# Patient Record
Sex: Male | Born: 1995 | Race: Black or African American | Hispanic: No | Marital: Single | Smoking: Current every day smoker
Health system: Southern US, Community
[De-identification: ages and names within clinical notes are randomized; demographics above are authoritative.]

---
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
## 2014-01-21 ENCOUNTER — Encounter (HOSPITAL_COMMUNITY): Payer: Self-pay | Admitting: Emergency Medicine

## 2014-01-21 ENCOUNTER — Emergency Department (HOSPITAL_COMMUNITY)
Admission: EM | Admit: 2014-01-21 | Discharge: 2014-01-21 | Disposition: A | Discharge status: Home / Self Care | Payer: Self-pay | Attending: Emergency Medicine | Admitting: Emergency Medicine

## 2014-01-21 DIAGNOSIS — S0003XA Contusion of scalp, initial encounter: Secondary | ICD-10-CM | POA: Insufficient documentation

## 2014-01-21 DIAGNOSIS — S0083XA Contusion of other part of head, initial encounter: Secondary | ICD-10-CM

## 2014-01-21 DIAGNOSIS — S1093XA Contusion of unspecified part of neck, initial encounter: Secondary | ICD-10-CM

## 2014-01-21 DIAGNOSIS — S0093XA Contusion of unspecified part of head, initial encounter: Secondary | ICD-10-CM

## 2014-01-21 DIAGNOSIS — F17200 Nicotine dependence, unspecified, uncomplicated: Secondary | ICD-10-CM | POA: Insufficient documentation

## 2014-01-21 DIAGNOSIS — IMO0002 Reserved for concepts with insufficient information to code with codable children: Secondary | ICD-10-CM | POA: Insufficient documentation

## 2014-01-21 DIAGNOSIS — S01311A Laceration without foreign body of right ear, initial encounter: Secondary | ICD-10-CM

## 2014-01-21 DIAGNOSIS — S01309A Unspecified open wound of unspecified ear, initial encounter: Secondary | ICD-10-CM | POA: Insufficient documentation

## 2014-01-21 MED ORDER — NAPROXEN 500 MG PO TABS: 500.0000 mg | ORAL_TABLET | Freq: Two times a day (BID) | ORAL | Status: AC

## 2014-01-21 MED ORDER — NAPROXEN 250 MG PO TABS
500.0000 mg | ORAL_TABLET | Freq: Once | ORAL | Status: AC
  Administered 2014-01-21: 500 mg via ORAL
  Filled 2014-01-21: qty 2

## 2014-01-21 NOTE — Discharge Instructions (Signed)
Emergency Department Resource Guide °1) Find a Doctor and Pay Out of Pocket °Although you won't have to find out who is covered by your insurance plan, it is a good idea to ask around and get recommendations. You will then need to call the office and see if the doctor you have chosen will accept you as a new patient and what types of options they offer for patients who are self-pay. Some doctors offer discounts or will set up payment plans for their patients who do not have insurance, but you will need to ask so you aren't surprised when you get to your appointment. ° °2) Contact Your Local Health Department °Not all health departments have doctors that can see patients for sick visits, but many do, so it is worth a call to see if yours does. If you don't know where your local health department is, you can check in your phone book. The CDC also has a tool to help you locate your state's health department, and many state websites also have listings of all of their local health departments. ° °3) Find a Walk-in Clinic °If your illness is not likely to be very severe or complicated, you may want to try a walk in clinic. These are popping up all over the country in pharmacies, drugstores, and shopping centers. They're usually staffed by nurse practitioners or physician assistants that have been trained to treat common illnesses and complaints. They're usually fairly quick and inexpensive. However, if you have serious medical issues or chronic medical problems, these are probably not your best option. ° °No Primary Care Doctor: °- Call [facility] at  832-8000 - they can help you locate a primary care doctor that  accepts your insurance, provides certain services, etc. °- Physician Referral Service- 1-800-533-3463 ° °Chronic Pain Problems: °Organization         Address  Phone   Notes  °[facility]  ([phone]) [phone] Patients need to be referred by their primary care doctor.  ° °Medication  Assistance: °Organization         Address  Phone   Notes  °[place] [facility] [address] °[place], [place] [address] ([phone]) 641-8030 --Must be a resident of [place] °-- Must have NO insurance coverage whatsoever (no Medicaid/ Medicare, etc.) °-- The pt. MUST have a primary care doctor that directs their care regularly and follows them in the community °  °[facility]  (866) 331-1348   °[employer]  (888) 892-1162   ° °Agencies that provide inexpensive medical care: °Organization         Address                                                       Phone                                                                            Notes  °[facility]  ([phone]) [phone]   °[facility]    (336)   832-7272   °[facility] [address] °[place], [place] [address] (336) 832-4777   °[facility] [address], °[place] (336) 271-4999   °[facility]    (336) 373-0678   °[facility]    (336) 272-1050   °[facility] ° [address], [place] Phone:  (336) 832-4444, Fax:  (336) [phone] Hours of Operation:  9 am - 6 pm, M-F.  Also accepts Medicaid/Medicare and self-pay.  °[facility] ° [address], [place] Phone: (336) [phone], Fax: (336) [phone]. Hours of Operation:  8:30 am - 5:30 pm, M-F.  Also accepts Medicaid and self-pay.  °[facility] [place] [address], [place] Phone: (336) [phone]   °[facility] [address], [place], [place] (336)723-1848, Ext. 123 Mondays & Thursdays: 7-9 AM.  First 15 patients are seen on a first come, first serve basis. °  ° °Medicaid-accepting [place] Providers: ° °Organization         Address                                                                       Phone                               Notes  °[facility] [address], [place] (336) [phone] Also accepts self-pay patients.  °[facility] [address], [place] ° (336) [phone]   °[facility] [address], [place] (336) [phone]   °[facility] [address] [place] [address], [place] (336) 299-7000   °[facility] [address], [place]  ° (336) [phone] Only accepts [place] Access Medicaid patients after they have their name applied to their card.  ° °Self-Pay (no insurance) in [place]: °  °Organization         Address                                                     Phone               Notes  °Sickle Cell Patients, [facility] [address], [place] (336) [phone]   °[facility] [address], [place] (336) [phone]   °[facility] ° [address], [place] (336) [phone]   °[facility]/Dr. Osei-Bonsu ° [address] [place] [address], [place] or [address], [place] ([phone]) [phone] Phone number for both [place] and [place] locations is the same.  °[facility] [address], [place] (336) [phone]   °[facility] [address] [place] [address], [place] or [address] (336) 852-7530 °(336) 878-2260   °[facility] [address],  (336) [phone], phone; (336) [phone], fax Sees patients 1st and 3rd Saturday of   every month.  Must not qualify for public or private insurance (i.e. Medicaid, Medicare, [place] Health Choice, Veterans' Benefits)  Household income should be no more than 200% of the poverty level The clinic cannot treat you if you are pregnant or think you are pregnant  Sexually transmitted diseases are not treated at the clinic.    Blunt Trauma You have been evaluated for injuries. You have been examined and your caregiver has not found injuries serious enough to require  hospitalization. It is common to have multiple bruises and sore muscles following an accident. These tend to feel worse for the first 24 hours. You will feel more stiffness and soreness over the next several hours and worse when you wake up the first morning after your accident. After this point, you should begin to improve with each passing day. The amount of improvement depends on the amount of damage done in the accident. Following your accident, if some part of your body does not work as it should, or if the pain in any area continues to increase, you should return to the Emergency Department for re-evaluation.  HOME CARE INSTRUCTIONS  Routine care for sore areas should include:  Ice to sore areas every 2 hours for 20 minutes while awake for the next 2 days.  Drink extra fluids (not alcohol).  Take a hot or warm shower or bath once or twice a day to increase blood flow to sore muscles. This will help you "limber up".  Activity as tolerated. Lifting may aggravate neck or back pain.  Only take over-the-counter or prescription medicines for pain, discomfort, or fever as directed by your caregiver. Do not use aspirin. This may increase bruising or increase bleeding if there are small areas where this is happening. SEEK IMMEDIATE MEDICAL CARE IF:  Numbness, tingling, weakness, or problem with the use of your arms or legs.  A severe headache is not relieved with medications.  There is a change in bowel or bladder control.  Increasing pain in any areas of the body.  Short of breath or dizzy.  Nauseated, vomiting, or sweating.  Increasing belly (abdominal) discomfort.  Blood in urine, stool, or vomiting blood.  Pain in either shoulder in an area where a shoulder strap would be.  Feelings of lightheadedness or if you have a fainting episode. Sometimes it is not possible to identify all injuries immediately after the trauma. It is important that you continue to monitor your condition  after the emergency department visit. If you feel you are not improving, or improving more slowly than should be expected, call your physician. If you feel your symptoms (problems) are worsening, return to the Emergency Department immediately. Document Released: 07/31/2001 Document Revised: 01/27/2012 Document Reviewed: 06/22/2008 [facility] Patient Information 2014 [place], [place].

## 2014-01-21 NOTE — ED Notes (Signed)
E-signature pad in room or at nurses station not working
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
## 2014-01-21 NOTE — ED Notes (Signed)
The pt is also c/o a headache 
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
## 2014-01-22 NOTE — ED Provider Notes (Signed)
Medical screening examination/treatment/procedure(s) were performed by non-physician practitioner and as supervising physician I was immediately available for consultation/collaboration.   EKG Interpretation None        Myrna Vonseggern W. Aryn Kops, MD 01/22/14 1936 

## 2014-03-18 ENCOUNTER — Emergency Department (HOSPITAL_COMMUNITY)
Admission: EM | Admit: 2014-03-18 | Discharge: 2014-03-18 | Disposition: A | Discharge status: Home / Self Care | Payer: Self-pay | Attending: Emergency Medicine | Admitting: Emergency Medicine

## 2014-03-18 ENCOUNTER — Encounter (HOSPITAL_COMMUNITY): Payer: Self-pay | Admitting: Emergency Medicine

## 2014-03-18 DIAGNOSIS — F17200 Nicotine dependence, unspecified, uncomplicated: Secondary | ICD-10-CM | POA: Insufficient documentation

## 2014-03-18 DIAGNOSIS — H6690 Otitis media, unspecified, unspecified ear: Secondary | ICD-10-CM | POA: Insufficient documentation

## 2014-03-18 DIAGNOSIS — H6691 Otitis media, unspecified, right ear: Secondary | ICD-10-CM

## 2014-03-18 DIAGNOSIS — Z791 Long term (current) use of non-steroidal anti-inflammatories (NSAID): Secondary | ICD-10-CM | POA: Insufficient documentation

## 2014-03-18 DIAGNOSIS — R51 Headache: Secondary | ICD-10-CM | POA: Insufficient documentation

## 2014-03-18 MED ORDER — AMOXICILLIN 500 MG PO CAPS: 500.0000 mg | ORAL_CAPSULE | Freq: Three times a day (TID) | ORAL | Status: DC

## 2014-03-18 NOTE — ED Provider Notes (Signed)
Medical screening examination/treatment/procedure(s) were performed by non-physician practitioner and as supervising physician I was immediately available for consultation/collaboration.  Tore Carreker T Dior Stepter, MD 03/18/14 2315 

## 2014-03-18 NOTE — Discharge Instructions (Signed)
Take antibiotic to completion for the next 7 days. Take ibuprofen or tylenol every 6 hours as needed for pain.  Otitis Media, Adult Otitis media is redness, soreness, and swelling (inflammation) of the middle ear. Otitis media may be caused by allergies or, most commonly, by infection. Often it occurs as a complication of the common cold. SIGNS AND SYMPTOMS Symptoms of otitis media may include:  Earache.  Fever.  Ringing in your ear.  Headache.  Leakage of fluid from the ear. DIAGNOSIS To diagnose otitis media, your health care provider will examine your ear with an otoscope. This is an instrument that allows your health care provider to see into your ear in order to examine your eardrum. Your health care provider also will ask you questions about your symptoms. TREATMENT  Typically, otitis media resolves on its own within 3 5 days. Your health care provider may prescribe medicine to ease your symptoms of pain. If otitis media does not resolve within 5 days or is recurrent, your health care provider may prescribe antibiotic medicines if he or she suspects that a bacterial infection is the cause. HOME CARE INSTRUCTIONS   Take your medicine as directed until it is gone, even if you feel better after the first few days.  Only take over-the-counter or prescription medicines for pain, discomfort, or fever as directed by your health care provider.  Follow up with your health care provider as directed. SEEK MEDICAL CARE IF:  You have otitis media only in one ear or bleeding from your nose or both.  You notice a lump on your neck.  You are not getting better in 3 5 days.  You feel worse instead of better. SEEK IMMEDIATE MEDICAL CARE IF:   You have pain that is not controlled with medicine.  You have swelling, redness, or pain around your ear or stiffness in your neck.  You notice that part of your face is paralyzed.  You notice that the bone behind your ear (mastoid) is tender  when you touch it. MAKE SURE YOU:   Understand these instructions.  Will watch your condition.  Will get help right away if you are not doing well or get worse. Document Released: 08/09/2004 Document Revised: 08/25/2013 Document Reviewed: 06/01/2013 [facility] Patient Information 2014 [place] Freedom[place]Care, [place]LLC.

## 2014-03-18 NOTE — ED Provider Notes (Signed)
CSN: [id number]     Arrival date & time 03/18/14  1450 History  This chart was scribed for non-physician practitioner Johnnette Gourdobyn Albert, PA-C working with Toy BakerAnthony T Allen, MD by Danella Maiersaroline Early, ED Scribe. This patient was seen in room WTR9/WTR9 and the patient's care was started at 3:30 PM.    Chief Complaint  Patient presents with  . Otalgia   The history is provided by the patient. No language interpreter was used.   HPI Comments: Manuel Blair is a 18 y.o. male who presents to the Emergency Department complaining of right otalgia onset this morning with associated headaches. He states the ear feels like it is popping. He rates the severity of the pain as an 8/10 currently. He took Benadryl with no relief. He denies ear discharge, nasal congestion, fevers, or tooth pain. He denies any problems with the left ear. He states his one-year-old sister is sick but if not sure what she has, she does not go to daycare.   History reviewed. No pertinent past medical history. History reviewed. No pertinent past surgical history. History reviewed. No pertinent family history. History  Substance Use Topics  . Smoking status: Current Every Day Smoker  . Smokeless tobacco: Not on file  . Alcohol Use: Yes    Review of Systems  Constitutional: Negative for fever.  HENT: Positive for ear pain. Negative for congestion, dental problem and ear discharge.    A complete 10 system review of systems was obtained and all systems are negative except as noted in the HPI and PMH.     Allergies  Review of patient's allergies indicates no known allergies.  Home Medications   Prior to Admission medications   Medication Sig Start Date End Date Taking? Authorizing Provider  naproxen (NAPROSYN) 500 MG tablet Take 1 tablet (500 mg total) by mouth 2 (two) times daily with a meal. 01/21/14   Junius FinnerErin O'Malley, PA-C   BP 144/91  Pulse 55  Temp(Src) 98.2 F (36.8 C) (Oral)  Resp 16  SpO2 100% Physical Exam  Nursing note  and vitals reviewed. Constitutional: He is oriented to person, place, and time. He appears well-developed and well-nourished. No distress.  HENT:  Head: Normocephalic and atraumatic.  Right Ear: No drainage. Tympanic membrane is injected, erythematous and bulging. No middle ear effusion.  Left Ear: Tympanic membrane normal.  Eyes: Conjunctivae and EOM are normal.  Neck: Normal range of motion. Neck supple.  Cardiovascular: Normal rate, regular rhythm and normal heart sounds.   Pulmonary/Chest: Effort normal and breath sounds normal.  Musculoskeletal: Normal range of motion. He exhibits no edema.  Neurological: He is alert and oriented to person, place, and time.  Skin: Skin is warm and dry.  Psychiatric: He has a normal mood and affect. His behavior is normal.    ED Course  Procedures (including critical care time) Medications - No data to display  DIAGNOSTIC STUDIES: Oxygen Saturation is 100% on RA, normal by my interpretation.    COORDINATION OF CARE: 3:41 PM- Discussed treatment plan with pt which includes discharge home with antibiotics. Pt agrees to plan.    Labs Review Labs Reviewed - No data to display  Imaging Review No results found.   EKG Interpretation None      MDM   Final diagnoses:  Otitis media, right    Tx with amoxil. Advised NSAIDs for pain. Stable for d/c. Return precautions given. Patient states understanding of treatment care plan and is agreeable.   I personally performed the services  described in this documentation, which was scribed in my presence. The recorded information has been reviewed and is accurate.   Trevor MaceRobyn M Albert, PA-C 03/18/14 [phone]

## 2014-03-18 NOTE — ED Notes (Signed)
Pt reports right ear pain starting this morning. Denies any discharge, sts pain is 8/10.

## 2014-03-18 NOTE — Progress Notes (Signed)
[facility] provided pt with a list of primary care resources to help patient establish primary care.  °

## 2019-11-24 ENCOUNTER — Emergency Department (HOSPITAL_COMMUNITY): Payer: No Typology Code available for payment source

## 2019-11-24 ENCOUNTER — Inpatient Hospital Stay (HOSPITAL_COMMUNITY)
Admission: EM | Admit: 2019-11-24 | Discharge: 2019-12-02 | DRG: 471 | Disposition: A | Discharge status: Home Health | Payer: No Typology Code available for payment source | Attending: General Surgery | Admitting: General Surgery

## 2019-11-24 ENCOUNTER — Encounter (HOSPITAL_COMMUNITY): Admission: EM | Disposition: A | Discharge status: Home Health | Payer: Self-pay | Source: Home / Self Care

## 2019-11-24 ENCOUNTER — Inpatient Hospital Stay (HOSPITAL_COMMUNITY): Payer: No Typology Code available for payment source

## 2019-11-24 ENCOUNTER — Inpatient Hospital Stay (HOSPITAL_COMMUNITY): Payer: No Typology Code available for payment source | Admitting: Certified Registered Nurse Anesthetist

## 2019-11-24 ENCOUNTER — Encounter (HOSPITAL_COMMUNITY): Payer: Self-pay | Admitting: General Surgery

## 2019-11-24 DIAGNOSIS — S15109A Unspecified injury of unspecified vertebral artery, initial encounter: Secondary | ICD-10-CM | POA: Diagnosis present

## 2019-11-24 DIAGNOSIS — R0902 Hypoxemia: Secondary | ICD-10-CM

## 2019-11-24 DIAGNOSIS — S22039A Unspecified fracture of third thoracic vertebra, initial encounter for closed fracture: Secondary | ICD-10-CM | POA: Diagnosis present

## 2019-11-24 DIAGNOSIS — J939 Pneumothorax, unspecified: Secondary | ICD-10-CM

## 2019-11-24 DIAGNOSIS — S42101A Fracture of unspecified part of scapula, right shoulder, initial encounter for closed fracture: Secondary | ICD-10-CM | POA: Diagnosis present

## 2019-11-24 DIAGNOSIS — S270XXA Traumatic pneumothorax, initial encounter: Secondary | ICD-10-CM

## 2019-11-24 DIAGNOSIS — S12600A Unspecified displaced fracture of seventh cervical vertebra, initial encounter for closed fracture: Secondary | ICD-10-CM | POA: Diagnosis present

## 2019-11-24 DIAGNOSIS — S12601A Unspecified nondisplaced fracture of seventh cervical vertebra, initial encounter for closed fracture: Secondary | ICD-10-CM

## 2019-11-24 DIAGNOSIS — D62 Acute posthemorrhagic anemia: Secondary | ICD-10-CM | POA: Diagnosis not present

## 2019-11-24 DIAGNOSIS — T1490XA Injury, unspecified, initial encounter: Secondary | ICD-10-CM

## 2019-11-24 DIAGNOSIS — S2243XA Multiple fractures of ribs, bilateral, initial encounter for closed fracture: Secondary | ICD-10-CM | POA: Diagnosis present

## 2019-11-24 DIAGNOSIS — Z23 Encounter for immunization: Secondary | ICD-10-CM

## 2019-11-24 DIAGNOSIS — S22049A Unspecified fracture of fourth thoracic vertebra, initial encounter for closed fracture: Secondary | ICD-10-CM | POA: Diagnosis present

## 2019-11-24 DIAGNOSIS — S42102A Fracture of unspecified part of scapula, left shoulder, initial encounter for closed fracture: Secondary | ICD-10-CM | POA: Diagnosis present

## 2019-11-24 DIAGNOSIS — M4802 Spinal stenosis, cervical region: Secondary | ICD-10-CM | POA: Diagnosis present

## 2019-11-24 DIAGNOSIS — S2231XA Fracture of one rib, right side, initial encounter for closed fracture: Secondary | ICD-10-CM

## 2019-11-24 DIAGNOSIS — S22019A Unspecified fracture of first thoracic vertebra, initial encounter for closed fracture: Principal | ICD-10-CM | POA: Diagnosis present

## 2019-11-24 DIAGNOSIS — S22059A Unspecified fracture of T5-T6 vertebra, initial encounter for closed fracture: Secondary | ICD-10-CM | POA: Diagnosis present

## 2019-11-24 DIAGNOSIS — S272XXA Traumatic hemopneumothorax, initial encounter: Secondary | ICD-10-CM | POA: Diagnosis present

## 2019-11-24 DIAGNOSIS — J942 Hemothorax: Secondary | ICD-10-CM

## 2019-11-24 DIAGNOSIS — R1310 Dysphagia, unspecified: Secondary | ICD-10-CM | POA: Diagnosis present

## 2019-11-24 DIAGNOSIS — S42202A Unspecified fracture of upper end of left humerus, initial encounter for closed fracture: Secondary | ICD-10-CM

## 2019-11-24 DIAGNOSIS — S42252A Displaced fracture of greater tuberosity of left humerus, initial encounter for closed fracture: Secondary | ICD-10-CM | POA: Diagnosis present

## 2019-11-24 DIAGNOSIS — S27322A Contusion of lung, bilateral, initial encounter: Secondary | ICD-10-CM

## 2019-11-24 DIAGNOSIS — J90 Pleural effusion, not elsewhere classified: Secondary | ICD-10-CM

## 2019-11-24 DIAGNOSIS — Y92410 Unspecified street and highway as the place of occurrence of the external cause: Secondary | ICD-10-CM

## 2019-11-24 DIAGNOSIS — Z419 Encounter for procedure for purposes other than remedying health state, unspecified: Secondary | ICD-10-CM

## 2019-11-24 DIAGNOSIS — Z20822 Contact with and (suspected) exposure to covid-19: Secondary | ICD-10-CM | POA: Diagnosis present

## 2019-11-24 HISTORY — PX: ANTERIOR CERVICAL CORPECTOMY: SHX1159

## 2019-11-24 LAB — CBC
HCT: 45.6 % (ref 39.0–52.0)
HCT: 41.6 % (ref 39.0–52.0)
Hemoglobin: 14.3 g/dL (ref 13.0–17.0)
Hemoglobin: 13.3 g/dL (ref 13.0–17.0)
MCH: 28.5 pg (ref 26.0–34.0)
MCH: 28.5 pg (ref 26.0–34.0)
MCHC: 31.4 g/dL (ref 30.0–36.0)
MCHC: 32 g/dL (ref 30.0–36.0)
MCV: 90.8 fL (ref 80.0–100.0)
MCV: 89.3 fL (ref 80.0–100.0)
Platelets: 252 10*3/uL (ref 150–400)
Platelets: 215 10*3/uL (ref 150–400)
RBC: 5.02 MIL/uL (ref 4.22–5.81)
RBC: 4.66 MIL/uL (ref 4.22–5.81)
RDW: 12.4 % (ref 11.5–15.5)
RDW: 12.5 % (ref 11.5–15.5)
WBC: 20.2 10*3/uL — ABNORMAL HIGH (ref 4.0–10.5)
WBC: 22.3 10*3/uL — ABNORMAL HIGH (ref 4.0–10.5)
nRBC: 0.1 % (ref 0.0–0.2)
nRBC: 0 % (ref 0.0–0.2)

## 2019-11-24 LAB — BASIC METABOLIC PANEL
Anion gap: 9 (ref 5–15)
BUN: 11 mg/dL (ref 6–20)
CO2: 25 mmol/L (ref 22–32)
Calcium: 8.1 mg/dL — ABNORMAL LOW (ref 8.9–10.3)
Chloride: 102 mmol/L (ref 98–111)
Creatinine, Ser: 1.04 mg/dL (ref 0.61–1.24)
GFR calc Af Amer: >60 mL/min (ref >60)
GFR calc non Af Amer: >60 mL/min (ref >60)
Glucose, Bld: 144 mg/dL — ABNORMAL HIGH (ref 70–99)
Potassium: 3.7 mmol/L (ref 3.5–5.1)
Sodium: 136 mmol/L (ref 135–145)

## 2019-11-24 LAB — COMPREHENSIVE METABOLIC PANEL
ALT: 72 U/L — ABNORMAL HIGH (ref 0–44)
AST: 138 U/L — ABNORMAL HIGH (ref 15–41)
Albumin: 3.8 g/dL (ref 3.5–5.0)
Alkaline Phosphatase: 64 U/L (ref 38–126)
Anion gap: 11 (ref 5–15)
BUN: 14 mg/dL (ref 6–20)
CO2: 23 mmol/L (ref 22–32)
Calcium: 8.5 mg/dL — ABNORMAL LOW (ref 8.9–10.3)
Chloride: 103 mmol/L (ref 98–111)
Creatinine, Ser: 1.24 mg/dL (ref 0.61–1.24)
GFR calc Af Amer: >60 mL/min (ref >60)
GFR calc non Af Amer: >60 mL/min (ref >60)
Glucose, Bld: 201 mg/dL — ABNORMAL HIGH (ref 70–99)
Potassium: 3.3 mmol/L — ABNORMAL LOW (ref 3.5–5.1)
Sodium: 137 mmol/L (ref 135–145)
Total Bilirubin: 0.5 mg/dL (ref 0.3–1.2)
Total Protein: 6.6 g/dL (ref 6.5–8.1)

## 2019-11-24 LAB — URINALYSIS, ROUTINE W REFLEX MICROSCOPIC
Bilirubin Urine: NEGATIVE
Glucose, UA: 50 mg/dL — AB
Hgb urine dipstick: NEGATIVE
Ketones, ur: 5 mg/dL — AB
Leukocytes,Ua: NEGATIVE
Nitrite: NEGATIVE
Protein, ur: NEGATIVE mg/dL
Specific Gravity, Urine: 1.043 — ABNORMAL HIGH (ref 1.005–1.030)
pH: 5 (ref 5.0–8.0)

## 2019-11-24 LAB — PROTIME-INR
INR: 1.1 (ref 0.8–1.2)
Prothrombin Time: 14.1 seconds (ref 11.4–15.2)

## 2019-11-24 LAB — RESPIRATORY PANEL BY RT PCR (FLU A&B, COVID)
Influenza A by PCR: NEGATIVE
Influenza B by PCR: NEGATIVE
SARS Coronavirus 2 by RT PCR: NEGATIVE

## 2019-11-24 LAB — I-STAT CHEM 8, ED
BUN: 14 mg/dL (ref 6–20)
Calcium, Ion: 1.06 mmol/L — ABNORMAL LOW (ref 1.15–1.40)
Chloride: 103 mmol/L (ref 98–111)
Creatinine, Ser: 1.1 mg/dL (ref 0.61–1.24)
Glucose, Bld: 196 mg/dL — ABNORMAL HIGH (ref 70–99)
HCT: 47 % (ref 39.0–52.0)
Hemoglobin: 16 g/dL (ref 13.0–17.0)
Potassium: 3.1 mmol/L — ABNORMAL LOW (ref 3.5–5.1)
Sodium: 139 mmol/L (ref 135–145)
TCO2: 25 mmol/L (ref 22–32)

## 2019-11-24 LAB — ETHANOL: Alcohol, Ethyl (B): <10 mg/dL (ref <10)

## 2019-11-24 LAB — ABO/RH: ABO/RH(D): O POS

## 2019-11-24 LAB — LACTIC ACID, PLASMA: Lactic Acid, Venous: 2.4 mmol/L (ref 0.5–1.9)

## 2019-11-24 LAB — MRSA PCR SCREENING: MRSA by PCR: NEGATIVE

## 2019-11-24 LAB — RAPID URINE DRUG SCREEN, HOSP PERFORMED
Amphetamines: NOT DETECTED
Barbiturates: NOT DETECTED
Benzodiazepines: NOT DETECTED
Cocaine: NOT DETECTED
Opiates: NOT DETECTED
Tetrahydrocannabinol: POSITIVE — AB

## 2019-11-24 LAB — CDS SEROLOGY

## 2019-11-24 LAB — SAMPLE TO BLOOD BANK

## 2019-11-24 LAB — HIV ANTIBODY (ROUTINE TESTING W REFLEX): HIV Screen 4th Generation wRfx: NONREACTIVE

## 2019-11-24 LAB — TYPE AND SCREEN
ABO/RH(D): O POS
Antibody Screen: NEGATIVE

## 2019-11-24 SURGERY — ANTERIOR CERVICAL CORPECTOMY
Anesthesia: General | Site: Spine Cervical

## 2019-11-24 MED ORDER — SODIUM CHLORIDE 0.9 % IV SOLN: INTRAVENOUS | Status: DC

## 2019-11-24 MED ORDER — TETANUS-DIPHTH-ACELL PERTUSSIS 5-2.5-18.5 LF-MCG/0.5 IM SUSP
0.5000 mL | Freq: Once | INTRAMUSCULAR | Status: AC
  Administered 2019-11-24: 01:00:00 0.5 mL via INTRAMUSCULAR
  Filled 2019-11-24: qty 0.5

## 2019-11-24 MED ORDER — SODIUM CHLORIDE 0.9 % IV SOLN: INTRAVENOUS | Status: DC | PRN

## 2019-11-24 MED ORDER — SODIUM CHLORIDE 0.9% FLUSH
3.0000 mL | INTRAVENOUS | Status: DC | PRN
  Administered 2019-12-01 – 2019-12-02 (×2): 3 mL via INTRAVENOUS

## 2019-11-24 MED ORDER — CEFAZOLIN SODIUM-DEXTROSE 1-4 GM/50ML-% IV SOLN
1.0000 g | Freq: Three times a day (TID) | INTRAVENOUS | Status: AC
  Administered 2019-11-24 – 2019-11-25 (×2): 1 g via INTRAVENOUS
  Filled 2019-11-24 (×2): qty 50

## 2019-11-24 MED ORDER — PROPOFOL 10 MG/ML IV BOLUS
INTRAVENOUS | Status: DC | PRN
  Administered 2019-11-24: 200 mg via INTRAVENOUS

## 2019-11-24 MED ORDER — ACETAMINOPHEN 500 MG PO TABS
1000.0000 mg | ORAL_TABLET | Freq: Four times a day (QID) | ORAL | Status: DC
  Administered 2019-11-24 – 2019-12-01 (×21): 1000 mg via ORAL
  Filled 2019-11-24 (×26): qty 2

## 2019-11-24 MED ORDER — 0.9 % SODIUM CHLORIDE (POUR BTL) OPTIME
TOPICAL | Status: DC | PRN
  Administered 2019-11-24: 1000 mL

## 2019-11-24 MED ORDER — CHLORHEXIDINE GLUCONATE CLOTH 2 % EX PADS
6.0000 | MEDICATED_PAD | Freq: Every day | CUTANEOUS | Status: DC
  Administered 2019-11-24: 20:00:00 6 via TOPICAL

## 2019-11-24 MED ORDER — FENTANYL CITRATE (PF) 250 MCG/5ML IJ SOLN
INTRAMUSCULAR | Status: AC
  Filled 2019-11-24: qty 5

## 2019-11-24 MED ORDER — PANTOPRAZOLE SODIUM 40 MG IV SOLR: 40.0000 mg | Freq: Every day | INTRAVENOUS | Status: DC

## 2019-11-24 MED ORDER — LACTATED RINGERS IV SOLN: INTRAVENOUS | Status: DC | PRN

## 2019-11-24 MED ORDER — ONDANSETRON HCL 4 MG/2ML IJ SOLN: 4.0000 mg | Freq: Four times a day (QID) | INTRAMUSCULAR | Status: DC | PRN

## 2019-11-24 MED ORDER — ROCURONIUM BROMIDE 10 MG/ML (PF) SYRINGE
PREFILLED_SYRINGE | INTRAVENOUS | Status: DC | PRN
  Administered 2019-11-24: 70 mg via INTRAVENOUS
  Administered 2019-11-24: 20 mg via INTRAVENOUS
  Administered 2019-11-24: 30 mg via INTRAVENOUS

## 2019-11-24 MED ORDER — ROCURONIUM BROMIDE 10 MG/ML (PF) SYRINGE
PREFILLED_SYRINGE | INTRAVENOUS | Status: AC
  Filled 2019-11-24: qty 10

## 2019-11-24 MED ORDER — HYDROMORPHONE HCL 1 MG/ML IJ SOLN
1.0000 mg | Freq: Once | INTRAMUSCULAR | Status: AC
  Administered 2019-11-24: 02:00:00 1 mg via INTRAVENOUS

## 2019-11-24 MED ORDER — DEXAMETHASONE SODIUM PHOSPHATE 10 MG/ML IJ SOLN
INTRAMUSCULAR | Status: DC | PRN
  Administered 2019-11-24: 10 mg via INTRAVENOUS

## 2019-11-24 MED ORDER — SODIUM CHLORIDE 0.9% FLUSH
3.0000 mL | Freq: Two times a day (BID) | INTRAVENOUS | Status: DC
  Administered 2019-11-25 – 2019-12-02 (×14): 3 mL via INTRAVENOUS

## 2019-11-24 MED ORDER — IOHEXOL 300 MG/ML  SOLN
100.0000 mL | Freq: Once | INTRAMUSCULAR | Status: AC | PRN
  Administered 2019-11-24: 02:00:00 100 mL via INTRAVENOUS

## 2019-11-24 MED ORDER — ACETAMINOPHEN 325 MG PO TABS: 650.0000 mg | ORAL_TABLET | ORAL | Status: DC | PRN

## 2019-11-24 MED ORDER — IOHEXOL 300 MG/ML  SOLN
75.0000 mL | Freq: Once | INTRAMUSCULAR | Status: AC | PRN
  Administered 2019-11-24: 05:00:00 75 mL via INTRAVENOUS

## 2019-11-24 MED ORDER — THROMBIN 5000 UNITS EX SOLR
CUTANEOUS | Status: AC
  Filled 2019-11-24: qty 10000

## 2019-11-24 MED ORDER — HYDROMORPHONE HCL 1 MG/ML IJ SOLN
1.0000 mg | INTRAMUSCULAR | Status: DC | PRN
  Administered 2019-11-25 – 2019-11-27 (×6): 1 mg via INTRAVENOUS
  Filled 2019-11-24 (×5): qty 1

## 2019-11-24 MED ORDER — PHENOL 1.4 % MT LIQD: 1.0000 | OROMUCOSAL | Status: DC | PRN

## 2019-11-24 MED ORDER — PANTOPRAZOLE SODIUM 40 MG PO TBEC
40.0000 mg | DELAYED_RELEASE_TABLET | Freq: Every day | ORAL | Status: DC
  Administered 2019-11-24 – 2019-11-25 (×2): 40 mg via ORAL
  Filled 2019-11-24 (×2): qty 1

## 2019-11-24 MED ORDER — SODIUM CHLORIDE 0.9 % IV BOLUS
1000.0000 mL | Freq: Once | INTRAVENOUS | Status: AC
  Administered 2019-11-24: 05:00:00 1000 mL via INTRAVENOUS

## 2019-11-24 MED ORDER — THROMBIN 20000 UNITS EX SOLR: CUTANEOUS | Status: DC | PRN

## 2019-11-24 MED ORDER — HYDROMORPHONE HCL 1 MG/ML IJ SOLN
INTRAMUSCULAR | Status: AC
  Filled 2019-11-24: qty 1

## 2019-11-24 MED ORDER — ONDANSETRON HCL 4 MG/2ML IJ SOLN
4.0000 mg | Freq: Once | INTRAMUSCULAR | Status: AC
  Administered 2019-11-24: 01:00:00 4 mg via INTRAVENOUS
  Filled 2019-11-24: qty 2

## 2019-11-24 MED ORDER — DEXAMETHASONE SODIUM PHOSPHATE 10 MG/ML IJ SOLN
INTRAMUSCULAR | Status: AC
  Filled 2019-11-24: qty 1

## 2019-11-24 MED ORDER — HYDROCODONE-ACETAMINOPHEN 10-325 MG PO TABS
2.0000 | ORAL_TABLET | ORAL | Status: DC | PRN
  Administered 2019-11-25: 21:00:00 2 via ORAL
  Filled 2019-11-24: qty 2

## 2019-11-24 MED ORDER — THROMBIN 5000 UNITS EX SOLR: OROMUCOSAL | Status: DC | PRN

## 2019-11-24 MED ORDER — CEFAZOLIN SODIUM-DEXTROSE 2-4 GM/100ML-% IV SOLN
INTRAVENOUS | Status: AC
  Filled 2019-11-24: qty 100

## 2019-11-24 MED ORDER — SUGAMMADEX SODIUM 200 MG/2ML IV SOLN
INTRAVENOUS | Status: DC | PRN
  Administered 2019-11-24: 150 mg via INTRAVENOUS

## 2019-11-24 MED ORDER — SODIUM CHLORIDE 0.9 % IV SOLN: 250.0000 mL | INTRAVENOUS | Status: DC

## 2019-11-24 MED ORDER — OXYCODONE HCL 5 MG PO TABS: 5.0000 mg | ORAL_TABLET | Freq: Once | ORAL | Status: DC | PRN

## 2019-11-24 MED ORDER — FENTANYL CITRATE (PF) 100 MCG/2ML IJ SOLN
INTRAMUSCULAR | Status: AC | PRN
  Administered 2019-11-24: 100 ug via INTRAVENOUS

## 2019-11-24 MED ORDER — FENTANYL CITRATE (PF) 100 MCG/2ML IJ SOLN: 25.0000 ug | INTRAMUSCULAR | Status: DC | PRN

## 2019-11-24 MED ORDER — MIDAZOLAM HCL 2 MG/2ML IJ SOLN
INTRAMUSCULAR | Status: DC | PRN
  Administered 2019-11-24: 1 mg via INTRAVENOUS

## 2019-11-24 MED ORDER — ONDANSETRON HCL 4 MG PO TABS: 4.0000 mg | ORAL_TABLET | Freq: Four times a day (QID) | ORAL | Status: DC | PRN

## 2019-11-24 MED ORDER — LORAZEPAM 2 MG/ML IJ SOLN: 1.0000 mg | Freq: Once | INTRAMUSCULAR | Status: DC

## 2019-11-24 MED ORDER — SODIUM CHLORIDE 0.9 % IV SOLN
INTRAVENOUS | Status: AC | PRN
  Administered 2019-11-24: 1000 mL via INTRAVENOUS

## 2019-11-24 MED ORDER — MENTHOL 3 MG MT LOZG
1.0000 | LOZENGE | OROMUCOSAL | Status: DC | PRN
  Filled 2019-11-24: qty 9

## 2019-11-24 MED ORDER — HYDROCODONE-ACETAMINOPHEN 5-325 MG PO TABS
1.0000 | ORAL_TABLET | ORAL | Status: DC | PRN
  Administered 2019-11-25 (×2): 1 via ORAL
  Filled 2019-11-24 (×2): qty 1

## 2019-11-24 MED ORDER — LIDOCAINE 2% (20 MG/ML) 5 ML SYRINGE
INTRAMUSCULAR | Status: DC | PRN
  Administered 2019-11-24: 100 mg via INTRAVENOUS

## 2019-11-24 MED ORDER — THROMBIN 20000 UNITS EX SOLR
CUTANEOUS | Status: AC
  Filled 2019-11-24: qty 20000

## 2019-11-24 MED ORDER — LIDOCAINE 2% (20 MG/ML) 5 ML SYRINGE
INTRAMUSCULAR | Status: AC
  Filled 2019-11-24: qty 5

## 2019-11-24 MED ORDER — PROMETHAZINE HCL 25 MG/ML IJ SOLN: 6.2500 mg | INTRAMUSCULAR | Status: DC | PRN

## 2019-11-24 MED ORDER — METHOCARBAMOL 1000 MG/10ML IJ SOLN
500.0000 mg | Freq: Three times a day (TID) | INTRAVENOUS | Status: DC
  Administered 2019-11-24 – 2019-11-25 (×5): 500 mg via INTRAVENOUS
  Filled 2019-11-24 (×13): qty 5

## 2019-11-24 MED ORDER — ALBUMIN HUMAN 5 % IV SOLN: INTRAVENOUS | Status: DC | PRN

## 2019-11-24 MED ORDER — FENTANYL CITRATE (PF) 100 MCG/2ML IJ SOLN: 100.0000 ug | Freq: Once | INTRAMUSCULAR | Status: DC

## 2019-11-24 MED ORDER — CEFAZOLIN SODIUM-DEXTROSE 2-4 GM/100ML-% IV SOLN: 2.0000 g | INTRAVENOUS | Status: DC

## 2019-11-24 MED ORDER — ONDANSETRON HCL 4 MG/2ML IJ SOLN
INTRAMUSCULAR | Status: DC | PRN
  Administered 2019-11-24: 4 mg via INTRAVENOUS

## 2019-11-24 MED ORDER — CYCLOBENZAPRINE HCL 10 MG PO TABS
10.0000 mg | ORAL_TABLET | Freq: Three times a day (TID) | ORAL | Status: DC | PRN
  Filled 2019-11-24: qty 1

## 2019-11-24 MED ORDER — PROPOFOL 10 MG/ML IV BOLUS
INTRAVENOUS | Status: AC
  Filled 2019-11-24: qty 20

## 2019-11-24 MED ORDER — ACETAMINOPHEN 650 MG RE SUPP: 650.0000 mg | RECTAL | Status: DC | PRN

## 2019-11-24 MED ORDER — HYDROMORPHONE HCL 1 MG/ML IJ SOLN
1.0000 mg | INTRAMUSCULAR | Status: DC | PRN
  Administered 2019-11-24: 06:00:00 1 mg via INTRAVENOUS
  Administered 2019-11-25: 17:00:00 2 mg via INTRAVENOUS
  Filled 2019-11-24: qty 2
  Filled 2019-11-24 (×3): qty 1

## 2019-11-24 MED ORDER — FENTANYL CITRATE (PF) 250 MCG/5ML IJ SOLN
INTRAMUSCULAR | Status: DC | PRN
  Administered 2019-11-24: 150 ug via INTRAVENOUS
  Administered 2019-11-24 (×2): 50 ug via INTRAVENOUS

## 2019-11-24 MED ORDER — CEFAZOLIN SODIUM-DEXTROSE 2-3 GM-%(50ML) IV SOLR
INTRAVENOUS | Status: DC | PRN
  Administered 2019-11-24: 2 g via INTRAVENOUS

## 2019-11-24 MED ORDER — THROMBIN 5000 UNITS EX SOLR
CUTANEOUS | Status: AC
  Filled 2019-11-24: qty 5000

## 2019-11-24 MED ORDER — OXYCODONE HCL 5 MG/5ML PO SOLN: 5.0000 mg | Freq: Once | ORAL | Status: DC | PRN

## 2019-11-24 MED ORDER — ONDANSETRON 4 MG PO TBDP: 4.0000 mg | ORAL_TABLET | Freq: Four times a day (QID) | ORAL | Status: DC | PRN

## 2019-11-24 MED ORDER — DEXTROSE-NACL 5-0.9 % IV SOLN: INTRAVENOUS | Status: DC

## 2019-11-24 MED ORDER — ONDANSETRON HCL 4 MG/2ML IJ SOLN
INTRAMUSCULAR | Status: AC
  Filled 2019-11-24: qty 2

## 2019-11-24 MED ORDER — MIDAZOLAM HCL 2 MG/2ML IJ SOLN
INTRAMUSCULAR | Status: AC
  Filled 2019-11-24: qty 2

## 2019-11-24 SURGICAL SUPPLY — 61 items
APL SKNCLS STERI-STRIP NONHPOA (GAUZE/BANDAGES/DRESSINGS) ×1
BAG DECANTER FOR FLEXI CONT (MISCELLANEOUS) ×3 IMPLANT
BAND INSRT 18 STRL LF DISP RB (MISCELLANEOUS) ×2
BAND RUBBER #18 3X1/16 STRL (MISCELLANEOUS) ×4 IMPLANT
BENZOIN TINCTURE PRP APPL 2/3 (GAUZE/BANDAGES/DRESSINGS) ×3 IMPLANT
BIT DRILL 13 (BIT) ×1 IMPLANT
BIT DRILL 13MM (BIT) ×1
BUR MATCHSTICK NEURO 3.0 LAGG (BURR) ×3 IMPLANT
CAGE PEEK 10X14X11 (Cage) ×4 IMPLANT
CAGE PEEK 10X14X11MM (Cage) ×2 IMPLANT
CANISTER SUCT 3000ML PPV (MISCELLANEOUS) ×3 IMPLANT
CARTRIDGE OIL MAESTRO DRILL (MISCELLANEOUS) ×1 IMPLANT
CLOSURE WOUND 1/2 X4 (GAUZE/BANDAGES/DRESSINGS) ×1
DIFFUSER DRILL AIR PNEUMATIC (MISCELLANEOUS) ×3 IMPLANT
DRAPE C-ARM 42X72 X-RAY (DRAPES) ×6 IMPLANT
DRAPE LAPAROTOMY 100X72 PEDS (DRAPES) ×3 IMPLANT
DRAPE MICROSCOPE LEICA (MISCELLANEOUS) ×3 IMPLANT
DURAPREP 6ML APPLICATOR 50/CS (WOUND CARE) ×3 IMPLANT
ELECT COATED BLADE 2.86 ST (ELECTRODE) ×3 IMPLANT
ELECT REM PT RETURN 9FT ADLT (ELECTROSURGICAL) ×3
ELECTRODE REM PT RTRN 9FT ADLT (ELECTROSURGICAL) ×1 IMPLANT
GAUZE 4X4 16PLY RFD (DISPOSABLE) IMPLANT
GAUZE SPONGE 4X4 12PLY STRL (GAUZE/BANDAGES/DRESSINGS) ×3 IMPLANT
GLOVE BIOGEL PI IND STRL 7.0 (GLOVE) IMPLANT
GLOVE BIOGEL PI IND STRL 7.5 (GLOVE) IMPLANT
GLOVE BIOGEL PI INDICATOR 7.0 (GLOVE) ×4
GLOVE BIOGEL PI INDICATOR 7.5 (GLOVE) ×6
GLOVE ECLIPSE 9.0 STRL (GLOVE) ×3 IMPLANT
GLOVE SS N UNI LF 7.0 STRL (GLOVE) ×6 IMPLANT
GOWN STRL REUS W/ TWL LRG LVL3 (GOWN DISPOSABLE) IMPLANT
GOWN STRL REUS W/ TWL XL LVL3 (GOWN DISPOSABLE) ×1 IMPLANT
GOWN STRL REUS W/TWL 2XL LVL3 (GOWN DISPOSABLE) IMPLANT
GOWN STRL REUS W/TWL LRG LVL3 (GOWN DISPOSABLE)
GOWN STRL REUS W/TWL XL LVL3 (GOWN DISPOSABLE) ×12
HALTER HD/CHIN CERV TRACTION D (MISCELLANEOUS) ×3 IMPLANT
HEMOSTAT POWDER KIT SURGIFOAM (HEMOSTASIS) ×4 IMPLANT
KIT BASIN OR (CUSTOM PROCEDURE TRAY) ×3 IMPLANT
KIT TURNOVER KIT B (KITS) ×3 IMPLANT
NDL SPNL 20GX3.5 QUINCKE YW (NEEDLE) ×1 IMPLANT
NEEDLE SPNL 20GX3.5 QUINCKE YW (NEEDLE) ×3 IMPLANT
NS IRRIG 1000ML POUR BTL (IV SOLUTION) ×3 IMPLANT
OIL CARTRIDGE MAESTRO DRILL (MISCELLANEOUS) ×3
PACK LAMINECTOMY NEURO (CUSTOM PROCEDURE TRAY) ×3 IMPLANT
PATTIES SURGICAL 1X1 (DISPOSABLE) ×2 IMPLANT
PEEK ANATOMIC STRUT 5X14X11MM (Peek) ×2 IMPLANT
PLATE 45MM (Plate) ×3 IMPLANT
PLATE 45XATL VS ELT (Plate) IMPLANT
SCREW ST 16X4XST FXANG NS (Screw) IMPLANT
SCREW ST FIX 4 ATL (Screw) ×12 IMPLANT
SPACER SPNL 11X14X10XPEEK (Cage) IMPLANT
SPCR SPNL 11X14X10XPEEK (Cage) ×2 IMPLANT
SPONGE INTESTINAL PEANUT (DISPOSABLE) ×5 IMPLANT
SPONGE SURGIFOAM ABS GEL 100 (HEMOSTASIS) ×3 IMPLANT
STRIP CLOSURE SKIN 1/2X4 (GAUZE/BANDAGES/DRESSINGS) ×2 IMPLANT
SUT VIC AB 4-0 RB1 18 (SUTURE) ×3 IMPLANT
TAPE CLOTH 4X10 WHT NS (GAUZE/BANDAGES/DRESSINGS) ×3 IMPLANT
TAPE CLOTH SURG 4X10 WHT LF (GAUZE/BANDAGES/DRESSINGS) ×2 IMPLANT
TOWEL GREEN STERILE (TOWEL DISPOSABLE) ×3 IMPLANT
TOWEL GREEN STERILE FF (TOWEL DISPOSABLE) ×3 IMPLANT
TRAP SPECIMEN MUCOUS 40CC (MISCELLANEOUS) ×3 IMPLANT
WATER STERILE IRR 1000ML POUR (IV SOLUTION) ×3 IMPLANT
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
## 2019-11-24 NOTE — Progress Notes (Signed)
Chaplain responded to Trauma page by phone.  Staff would page the chaplain if needed.  Lavone Neri Chaplain Resident For questions concerning this note please contact me by pager [phone]

## 2019-11-24 NOTE — ED Notes (Addendum)
Pt and RNs to CT 1
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
## 2019-11-24 NOTE — ED Notes (Signed)
Pt and RN back to CT
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
## 2019-11-24 NOTE — Transfer of Care (Signed)
Immediate Anesthesia Transfer of Care Note  Patient: Manuel Blair  Procedure(s) Performed: ANTERIOR CERVICAL CORPECTOMY  CERVICAL SEVEN (N/A Spine Cervical)  Patient Location: PACU  Anesthesia Type:General  Level of Consciousness: lethargic and responds to stimulation  Airway & Oxygen Therapy: Patient Spontanous Breathing and Patient connected to face mask oxygen  Post-op Assessment: Report given to RN  Post vital signs: Reviewed and stable  Last Vitals:  Vitals Value Taken Time  BP    Temp    Pulse    Resp    SpO2      Last Pain:  Vitals:   11/24/19 1208  TempSrc:   PainSc: 9          Complications: No apparent anesthesia complications
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
## 2019-11-24 NOTE — Anesthesia Procedure Notes (Addendum)
Procedure Name: Intubation Date/Time: 11/24/2019 3:42 PM Performed by: Gareth Eagle, CRNA Pre-anesthesia Checklist: Patient identified, Emergency Drugs available, Suction available and Patient being monitored Patient Re-evaluated:Patient Re-evaluated prior to induction Oxygen Delivery Method: Circle system utilized Preoxygenation: Pre-oxygenation with 100% oxygen Induction Type: IV induction and Rapid sequence Ventilation: Mask ventilation without difficulty Laryngoscope Size: Glidescope and 4 Grade View: Grade I Tube type: Oral Tube size: 7.5 mm Number of attempts: 1 Airway Equipment and Method: Video-laryngoscopy and Rigid stylet Placement Confirmation: ETT inserted through vocal cords under direct vision,  positive ETCO2 and breath sounds checked- equal and bilateral Secured at: 24 cm Tube secured with: Tape Dental Injury: Teeth and Oropharynx as per pre-operative assessment  Difficulty Due To: Difficult Airway- due to cervical collar Comments: Head and neck maintained in neutral spine position in C-collar during intubation.
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
## 2019-11-24 NOTE — ED Notes (Signed)
RN and pt to MRI.   °
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
## 2019-11-24 NOTE — Progress Notes (Signed)
Orthopedic Tech Progress Note Patient Details:  Manuel Blair 10/23/1996 [id number]  Ortho Devices Type of Ortho Device: Arm sling Ortho Device/Splint Location: lue Ortho Device/Splint Interventions: Ordered, Application, Adjustment   Post Interventions Patient Tolerated: Well Instructions Provided: Care of device, Adjustment of device   [name] 11/24/2019, 7:58 AM
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
## 2019-11-25 ENCOUNTER — Encounter (HOSPITAL_COMMUNITY): Payer: Self-pay

## 2019-11-25 ENCOUNTER — Other Ambulatory Visit: Payer: Self-pay

## 2019-11-25 ENCOUNTER — Inpatient Hospital Stay (HOSPITAL_COMMUNITY): Payer: No Typology Code available for payment source

## 2019-11-25 LAB — CBC
HCT: 34.9 % — ABNORMAL LOW (ref 39.0–52.0)
Hemoglobin: 11.3 g/dL — ABNORMAL LOW (ref 13.0–17.0)
MCH: 28.5 pg (ref 26.0–34.0)
MCHC: 32.4 g/dL (ref 30.0–36.0)
MCV: 88.1 fL (ref 80.0–100.0)
Platelets: 174 10*3/uL (ref 150–400)
RBC: 3.96 MIL/uL — ABNORMAL LOW (ref 4.22–5.81)
RDW: 12.6 % (ref 11.5–15.5)
WBC: 21.7 10*3/uL — ABNORMAL HIGH (ref 4.0–10.5)
nRBC: 0 % (ref 0.0–0.2)

## 2019-11-25 LAB — BASIC METABOLIC PANEL
Anion gap: 11 (ref 5–15)
BUN: 7 mg/dL (ref 6–20)
CO2: 24 mmol/L (ref 22–32)
Calcium: 8.7 mg/dL — ABNORMAL LOW (ref 8.9–10.3)
Chloride: 104 mmol/L (ref 98–111)
Creatinine, Ser: 0.88 mg/dL (ref 0.61–1.24)
GFR calc Af Amer: >60 mL/min (ref >60)
GFR calc non Af Amer: >60 mL/min (ref >60)
Glucose, Bld: 143 mg/dL — ABNORMAL HIGH (ref 70–99)
Potassium: 4.3 mmol/L (ref 3.5–5.1)
Sodium: 139 mmol/L (ref 135–145)

## 2019-11-25 MED ORDER — ASPIRIN 325 MG PO TABS
325.0000 mg | ORAL_TABLET | Freq: Every day | ORAL | Status: DC
  Administered 2019-11-25 – 2019-12-02 (×8): 325 mg via ORAL
  Filled 2019-11-25 (×8): qty 1

## 2019-11-25 MED FILL — Thrombin For Soln 5000 Unit: CUTANEOUS | Qty: 5000 | Status: AC

## 2019-11-25 NOTE — Progress Notes (Signed)
Rehab Admissions Coordinator Note:  Patient was screened by Stephania Fragmin for appropriateness for an Inpatient Acute Rehab Consult.  At this time, we are recommending Inpatient Rehab consult.  I will place an order so we can evaluate pt.   Stephania Fragmin 11/25/2019, 11:41 AM  I can be reached at [phone].
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
## 2019-11-26 ENCOUNTER — Inpatient Hospital Stay (HOSPITAL_COMMUNITY): Payer: No Typology Code available for payment source

## 2019-11-26 LAB — BASIC METABOLIC PANEL
Anion gap: 5 (ref 5–15)
BUN: 9 mg/dL (ref 6–20)
CO2: 29 mmol/L (ref 22–32)
Calcium: 8.3 mg/dL — ABNORMAL LOW (ref 8.9–10.3)
Chloride: 103 mmol/L (ref 98–111)
Creatinine, Ser: 0.92 mg/dL (ref 0.61–1.24)
GFR calc Af Amer: >60 mL/min (ref >60)
GFR calc non Af Amer: >60 mL/min (ref >60)
Glucose, Bld: 126 mg/dL — ABNORMAL HIGH (ref 70–99)
Potassium: 4.2 mmol/L (ref 3.5–5.1)
Sodium: 137 mmol/L (ref 135–145)

## 2019-11-26 LAB — CBC
HCT: 30.8 % — ABNORMAL LOW (ref 39.0–52.0)
Hemoglobin: 9.9 g/dL — ABNORMAL LOW (ref 13.0–17.0)
MCH: 28.8 pg (ref 26.0–34.0)
MCHC: 32.1 g/dL (ref 30.0–36.0)
MCV: 89.5 fL (ref 80.0–100.0)
Platelets: 162 10*3/uL (ref 150–400)
RBC: 3.44 MIL/uL — ABNORMAL LOW (ref 4.22–5.81)
RDW: 12.5 % (ref 11.5–15.5)
WBC: 16.1 10*3/uL — ABNORMAL HIGH (ref 4.0–10.5)
nRBC: 0 % (ref 0.0–0.2)

## 2019-11-26 MED ORDER — OXYCODONE HCL 5 MG/5ML PO SOLN
5.0000 mg | ORAL | Status: DC | PRN
  Administered 2019-11-26 – 2019-11-27 (×2): 10 mg
  Filled 2019-11-26 (×2): qty 10

## 2019-11-26 MED ORDER — METHOCARBAMOL 1000 MG/10ML IJ SOLN
1000.0000 mg | Freq: Three times a day (TID) | INTRAVENOUS | Status: DC
  Administered 2019-11-26 – 2019-11-29 (×10): 1000 mg via INTRAVENOUS
  Filled 2019-11-26 (×12): qty 10

## 2019-11-26 MED ORDER — KETOROLAC TROMETHAMINE 15 MG/ML IJ SOLN
30.0000 mg | Freq: Four times a day (QID) | INTRAMUSCULAR | Status: AC
  Administered 2019-11-26 – 2019-11-27 (×7): 30 mg via INTRAVENOUS
  Filled 2019-11-26 (×7): qty 2

## 2019-11-26 MED ORDER — ENOXAPARIN SODIUM 30 MG/0.3ML ~~LOC~~ SOLN
30.0000 mg | Freq: Two times a day (BID) | SUBCUTANEOUS | Status: DC
  Administered 2019-11-26 – 2019-12-01 (×12): 30 mg via SUBCUTANEOUS
  Filled 2019-11-26 (×12): qty 0.3
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
## 2019-11-26 NOTE — Progress Notes (Signed)
Inpatient Rehabilitation-Admissions Coordinator   Consult order received. Met with pt bedside for rehab assessment. Pt able to answer all my questions and does appear interested in CIR program as he knows he will need to get stronger to negotiate his 3 flights of stairs into his apartment. The patient does have concerns over the cost as he is currently uninsured. Financial counseling is following per notes in the chart. Pt unsure about DC support as his mom and his girlfriend work. I have attempted to call his mother, but her voicemail is full. Pt does not know his girlfriend's phone number and his cell is broken. Will continue attempts to reach his family.   Will follow up with pt Monday.   Raechel Ache, OTR/L  Rehab Admissions Coordinator  [phone] 11/26/2019 2:00 PM
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
## 2019-11-27 LAB — CBC
HCT: 28.4 % — ABNORMAL LOW (ref 39.0–52.0)
Hemoglobin: 9.1 g/dL — ABNORMAL LOW (ref 13.0–17.0)
MCH: 28.3 pg (ref 26.0–34.0)
MCHC: 32 g/dL (ref 30.0–36.0)
MCV: 88.5 fL (ref 80.0–100.0)
Platelets: 163 10*3/uL (ref 150–400)
RBC: 3.21 MIL/uL — ABNORMAL LOW (ref 4.22–5.81)
RDW: 12.4 % (ref 11.5–15.5)
WBC: 9.5 10*3/uL (ref 4.0–10.5)
nRBC: 0 % (ref 0.0–0.2)

## 2019-11-27 LAB — BASIC METABOLIC PANEL
Anion gap: 8 (ref 5–15)
BUN: 10 mg/dL (ref 6–20)
CO2: 25 mmol/L (ref 22–32)
Calcium: 8.2 mg/dL — ABNORMAL LOW (ref 8.9–10.3)
Chloride: 104 mmol/L (ref 98–111)
Creatinine, Ser: 0.93 mg/dL (ref 0.61–1.24)
GFR calc Af Amer: >60 mL/min (ref >60)
GFR calc non Af Amer: >60 mL/min (ref >60)
Glucose, Bld: 101 mg/dL — ABNORMAL HIGH (ref 70–99)
Potassium: 3.7 mmol/L (ref 3.5–5.1)
Sodium: 137 mmol/L (ref 135–145)

## 2019-11-27 LAB — MAGNESIUM: Magnesium: 1.7 mg/dL (ref 1.7–2.4)

## 2019-11-27 LAB — PHOSPHORUS: Phosphorus: 2.6 mg/dL (ref 2.5–4.6)

## 2019-11-27 MED ORDER — OXYCODONE HCL 5 MG PO TABS: 5.0000 mg | ORAL_TABLET | ORAL | Status: DC | PRN

## 2019-11-27 MED ORDER — CYCLOBENZAPRINE HCL 10 MG PO TABS
10.0000 mg | ORAL_TABLET | Freq: Three times a day (TID) | ORAL | Status: DC
  Administered 2019-11-27 – 2019-12-02 (×16): 10 mg via ORAL
  Filled 2019-11-27 (×17): qty 1
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
## 2019-11-28 LAB — CBC
HCT: 26.7 % — ABNORMAL LOW (ref 39.0–52.0)
Hemoglobin: 8.8 g/dL — ABNORMAL LOW (ref 13.0–17.0)
MCH: 29.2 pg (ref 26.0–34.0)
MCHC: 33 g/dL (ref 30.0–36.0)
MCV: 88.7 fL (ref 80.0–100.0)
Platelets: 191 10*3/uL (ref 150–400)
RBC: 3.01 MIL/uL — ABNORMAL LOW (ref 4.22–5.81)
RDW: 12.6 % (ref 11.5–15.5)
WBC: 8.3 10*3/uL (ref 4.0–10.5)
nRBC: 0 % (ref 0.0–0.2)

## 2019-11-28 LAB — BASIC METABOLIC PANEL
Anion gap: 6 (ref 5–15)
BUN: 9 mg/dL (ref 6–20)
CO2: 25 mmol/L (ref 22–32)
Calcium: 8.4 mg/dL — ABNORMAL LOW (ref 8.9–10.3)
Chloride: 108 mmol/L (ref 98–111)
Creatinine, Ser: 0.98 mg/dL (ref 0.61–1.24)
GFR calc Af Amer: >60 mL/min (ref >60)
GFR calc non Af Amer: >60 mL/min (ref >60)
Glucose, Bld: 108 mg/dL — ABNORMAL HIGH (ref 70–99)
Potassium: 3.7 mmol/L (ref 3.5–5.1)
Sodium: 139 mmol/L (ref 135–145)

## 2019-11-28 LAB — MAGNESIUM: Magnesium: 1.7 mg/dL (ref 1.7–2.4)

## 2019-11-28 LAB — PHOSPHORUS: Phosphorus: 2.7 mg/dL (ref 2.5–4.6)

## 2019-11-28 MED ORDER — DOCUSATE SODIUM 100 MG PO CAPS
100.0000 mg | ORAL_CAPSULE | Freq: Two times a day (BID) | ORAL | Status: DC
  Administered 2019-11-28 – 2019-12-02 (×8): 100 mg via ORAL
  Filled 2019-11-28 (×10): qty 1

## 2019-11-28 MED ORDER — POLYETHYLENE GLYCOL 3350 17 G PO PACK
17.0000 g | PACK | Freq: Every day | ORAL | Status: DC
  Administered 2019-11-28 – 2019-12-01 (×3): 17 g via ORAL
  Filled 2019-11-28 (×4): qty 1

## 2019-11-28 MED ORDER — KETOROLAC TROMETHAMINE 15 MG/ML IJ SOLN
30.0000 mg | Freq: Four times a day (QID) | INTRAMUSCULAR | Status: AC
  Administered 2019-11-28 – 2019-11-30 (×8): 30 mg via INTRAVENOUS
  Filled 2019-11-28 (×8): qty 2
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
## 2019-11-29 LAB — BASIC METABOLIC PANEL
Anion gap: 7 (ref 5–15)
BUN: 11 mg/dL (ref 6–20)
CO2: 25 mmol/L (ref 22–32)
Calcium: 8.4 mg/dL — ABNORMAL LOW (ref 8.9–10.3)
Chloride: 108 mmol/L (ref 98–111)
Creatinine, Ser: 0.91 mg/dL (ref 0.61–1.24)
GFR calc Af Amer: >60 mL/min (ref >60)
GFR calc non Af Amer: >60 mL/min (ref >60)
Glucose, Bld: 109 mg/dL — ABNORMAL HIGH (ref 70–99)
Potassium: 3.6 mmol/L (ref 3.5–5.1)
Sodium: 140 mmol/L (ref 135–145)

## 2019-11-29 LAB — PHOSPHORUS: Phosphorus: 3.4 mg/dL (ref 2.5–4.6)

## 2019-11-29 LAB — CBC
HCT: 27.1 % — ABNORMAL LOW (ref 39.0–52.0)
Hemoglobin: 8.9 g/dL — ABNORMAL LOW (ref 13.0–17.0)
MCH: 29.2 pg (ref 26.0–34.0)
MCHC: 32.8 g/dL (ref 30.0–36.0)
MCV: 88.9 fL (ref 80.0–100.0)
Platelets: 197 10*3/uL (ref 150–400)
RBC: 3.05 MIL/uL — ABNORMAL LOW (ref 4.22–5.81)
RDW: 12.7 % (ref 11.5–15.5)
WBC: 8.7 10*3/uL (ref 4.0–10.5)
nRBC: 0 % (ref 0.0–0.2)

## 2019-11-29 LAB — MAGNESIUM: Magnesium: 1.9 mg/dL (ref 1.7–2.4)

## 2019-11-29 MED ORDER — LIDOCAINE 5 % EX PTCH
1.0000 | MEDICATED_PATCH | CUTANEOUS | Status: DC
  Administered 2019-11-29 – 2019-12-02 (×4): 1 via TRANSDERMAL
  Filled 2019-11-29 (×4): qty 1

## 2019-11-29 MED ORDER — METHOCARBAMOL 500 MG PO TABS
1000.0000 mg | ORAL_TABLET | Freq: Three times a day (TID) | ORAL | Status: DC
  Administered 2019-11-29 – 2019-12-02 (×9): 1000 mg via ORAL
  Filled 2019-11-29 (×10): qty 2

## 2019-11-29 MED ORDER — OXYCODONE HCL 5 MG PO TABS
10.0000 mg | ORAL_TABLET | ORAL | Status: DC | PRN
  Administered 2019-11-29 (×2): 10 mg via ORAL
  Administered 2019-11-30 – 2019-12-02 (×6): 15 mg via ORAL
  Filled 2019-11-29 (×3): qty 3
  Filled 2019-11-29: qty 2
  Filled 2019-11-29 (×2): qty 3
  Filled 2019-11-29: qty 2
  Filled 2019-11-29: qty 3

## 2019-11-29 MED ORDER — GABAPENTIN 100 MG PO CAPS
100.0000 mg | ORAL_CAPSULE | Freq: Three times a day (TID) | ORAL | Status: DC
  Administered 2019-11-29 (×3): 100 mg via ORAL
  Filled 2019-11-29 (×3): qty 1

## 2019-11-29 NOTE — Progress Notes (Signed)
Inpatient Rehabilitation-Admissions Coordinator   Continued attempts to reach pt's family. I have contacted RN to see if she can notify me if the patient's visitor arrives and have instructed pt to give his visitor my number so that I can discuss CIR program and confirm support at DC.   Will continue to follow up once more information available.   Cheri Rous, OTR/L  Rehab Admissions Coordinator  [phone] 11/29/2019 11:58 AM
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
## 2019-11-29 NOTE — Anesthesia Postprocedure Evaluation (Signed)
Anesthesia Post Note  Patient: Manuel Blair  Procedure(s) Performed: ANTERIOR CERVICAL CORPECTOMY  CERVICAL SEVEN (N/A Spine Cervical)     Patient location during evaluation: PACU Anesthesia Type: General Level of consciousness: awake and alert Pain management: pain level controlled Vital Signs Assessment: post-procedure vital signs reviewed and stable Respiratory status: spontaneous breathing, nonlabored ventilation, respiratory function stable and patient connected to nasal cannula oxygen Cardiovascular status: blood pressure returned to baseline and stable Postop Assessment: no apparent nausea or vomiting Anesthetic complications: no    Last Vitals:  Vitals:   11/28/19 1717 11/28/19 2100  BP: 136/87 (!) 142/76  Pulse: 75 68  Resp: 20 19  Temp: 37.3 C 36.9 C  SpO2: 100% 99%    Last Pain:  Vitals:   11/28/19 2100  TempSrc: Axillary  PainSc:                  [name]
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
## 2019-11-30 MED ORDER — GABAPENTIN 100 MG PO CAPS
200.0000 mg | ORAL_CAPSULE | Freq: Three times a day (TID) | ORAL | Status: DC
  Administered 2019-11-30 – 2019-12-02 (×7): 200 mg via ORAL
  Filled 2019-11-30 (×8): qty 2

## 2019-11-30 NOTE — Progress Notes (Signed)
Overall stable.  Patient with continued posterior cervical and thoracic pain.  Still with some left upper extremity numbness tingling and some mild weakness-all improved from preop.  Wound clean and dry.  Overall progressing reasonably well.  Continue efforts at mobilization.  Hopefully will go to rehab soon
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
## 2019-12-01 NOTE — Progress Notes (Signed)
Inpatient Rehabilitation-Admissions Coordinator   Was finally able to speak with the patient's girlfriend, Ezekiel Ina [phone] regarding CIR program. She was able to confirm DC support. She prefers the patient do the IP Rehab program. I met back with the patient who has more reservations regarding the cost as he is uninsured. At this time it appears he is leaning towards DC home over CIR. He knows he will need help to navigate 3 flights of stairs and will need to have someone with him during the day. Per pt request, I spoke to the financial counselor to see if she can screen him for Medicaid/Disability. Pt knows I will need a final answer regarding disposition by tomorrow.   Will follow up tomorrow.   Raechel Ache, OTR/L  Rehab Admissions Coordinator  [phone] 12/01/2019 2:25 PM
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
## 2019-12-02 MED ORDER — METHOCARBAMOL 500 MG PO TABS: 1000.0000 mg | ORAL_TABLET | Freq: Three times a day (TID) | ORAL | 0 refills | Status: AC | PRN

## 2019-12-02 MED ORDER — GABAPENTIN 100 MG PO CAPS: 200.0000 mg | ORAL_CAPSULE | Freq: Three times a day (TID) | ORAL | 0 refills | Status: AC

## 2019-12-02 MED ORDER — OXYCODONE HCL 10 MG PO TABS: 10.0000 mg | ORAL_TABLET | Freq: Four times a day (QID) | ORAL | 0 refills | Status: AC | PRN

## 2019-12-02 MED ORDER — BISACODYL 10 MG RE SUPP: 10.0000 mg | Freq: Once | RECTAL | Status: DC

## 2019-12-02 MED ORDER — CYCLOBENZAPRINE HCL 10 MG PO TABS: 10.0000 mg | ORAL_TABLET | Freq: Three times a day (TID) | ORAL | 0 refills | Status: AC

## 2019-12-02 MED ORDER — ASPIRIN 325 MG PO TABS: 325.0000 mg | ORAL_TABLET | Freq: Every day | ORAL | 2 refills | Status: AC

## 2019-12-02 MED ORDER — ACETAMINOPHEN 500 MG PO TABS: 1000.0000 mg | ORAL_TABLET | Freq: Four times a day (QID) | ORAL | 0 refills | Status: AC

## 2019-12-02 MED FILL — GABAPENTIN 100 MG CAPSULE: 100 | 5 days supply | Qty: 30 | Fill #0

## 2019-12-02 MED FILL — ASPIRIN EC 325 MG TABLET: 325 | 30 days supply | Qty: 30 | Fill #0

## 2019-12-02 MED FILL — METHOCARBAMOL 500 MG TABS: 500 | 5 days supply | Qty: 30 | Fill #0

## 2019-12-02 MED FILL — CYCLOBENZAPRINE 10 MG TAB: 10 | 10 days supply | Qty: 30 | Fill #0

## 2019-12-02 MED FILL — oxyCODONE HCL 10 MG TABS: 10 | 5 days supply | Qty: 30 | Fill #0

## 2019-12-02 NOTE — Discharge Instructions (Signed)
Sling to left upper extremity when out of bed and moving around.  Non weightbearing to left arm. Weightbearing as tolerates to right upper extremity   How to Use a Hard Cervical Collar A hard cervical collar limits the movement of the top part of your spine (cervical spine). The collar holds your head and neck in a straight position. A cervical collar may be used to treat:  A fracture in the neck.  Damage to the ligaments. Ligaments are tissues that connect bones.  Injury to the spinal cord. There are several types of hard cervical collars. Follow the manufacturer's instructions for use. These are general guidelines. What are the risks? Wearing a cervical collar is safe. However, problems may occur, including:  Skin breakdown.  Sores that form due to rubbing or pressure on the skin (pressure ulcers).  Pain.  Difficulty breathing.  Worsening of your condition if the collar is not placed correctly.  Increased risk of inhaling food or liquid into your lungs (aspiration). Supplies needed:  Extra cervical collar and replacement pads.  Ice.  Plastic bag.  Towel.  A watertight covering to put over the collar during bathing, if needed. How to use a cervical collar  Wear the cervical collar as told by your health care provider. Do not remove it unless told to do so by your health care provider.  You may be directed to remove it only when you check your skin and change the pads. While the collar is off: ? Ask another person to assist you if needed. ? Keep your head and neck straight.  Do not bend your neck or turn your head. ? Check your skin daily for red areas. Ask for help or use a mirror to check areas you cannot see. ? After checking your skin, wear the extra cervical collar while cleaning and changing the pads of the other collar.  Change the pads daily or more often if they become wet or dirty. Keep a clean set of replacement pads.  Do not let hard plastic edges touch  your skin. Cover them with a soft pad.  If your cervical collar is not waterproof: ? Do not let it get wet. ? Cover it with a watertight covering when you take a bath or a shower. Follow these instructions at home:   Put ice on the injured area to manage pain, stiffness, and swelling. ? Do not remove your cervical collar unless told to do so by your health care provider. ? Put ice in a plastic bag. ? Place a towel between your skin and the bag or between your cervical collar and the bag. ? Leave the ice on for 20 minutes, 2-3 times a day for the first 2 days after your injury.  Do not drive any vehicle until your health care provider approves.  Keep all follow-up visits as told by your health care provider. This is important. Any delay in getting the care that you need can prevent proper healing of your injury. Contact a health care provider if you have:  Red areas of skin under your cervical collar.  Pain that is not controlled with your medicines. Get help right away if you have:  Numbness or weakness in your arms or legs.  Difficulty breathing. These symptoms may represent a serious problem that is an emergency. Do not wait to see if the symptoms will go away. Get medical help right away. Call your local emergency services (911 in the [place] Do not drive yourself to  the hospital. Summary  A cervical collar is a device that supports the chin and the back of the head. It restricts movement of the neck to prevent more damage after a severe injury.  A cervical collar may be used to treat a fracture in the neck, damage to tissues that hold bones together (ligaments), or injury to the spinal cord.  Wear the cervical collar as told by your health care provider. Ask if you may remove the collar to shower, bathe, or eat, or to put ice on your neck. This information is not intended to replace advice given to you by your health care provider. Make sure you discuss any questions you have  with your health care provider. Document Revised: 08/23/2019 Document Reviewed: 09/26/2017 Elsevier Patient Education  2020 [employer].
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
## 2019-12-02 NOTE — TOC Transition Note (Signed)
Transition of Care [facility]) - CM/SW Discharge Note   Patient Details  Name: Manuel Blair MRN: [id number] Date of Birth: [birth year]  Transition of Care [facility]) CM/SW Contact:  Glennon Mac, RN Phone Number: 12/02/2019, 3:39 PM   Clinical Narrative:  Pt has chosen to forego CIR and discharge home.  He is agreeable to [facility] follow up and DME.  Referral to [employer] [facility] for DME needs; 3 in 1 and cane to be delivered to bedside prior to dc.  Referral to [facility] for HHPT; LOG obtained for 2 weeks therapy at home, as charity agency unable to staff.  DC Rx sent to [facility] pharmacy to be filled using MATCH letter. Pt states girlfriend Chanel can be contacted by pharmacy for payment.  [phone].       Final next level of care: Home w Home Health Services Barriers to Discharge: Barriers Resolved   Patient Goals and CMS Choice Patient states their goals for this hospitalization and ongoing recovery are:: to go home CMS Medicare.gov Compare Post Acute Care list provided to:: Patient Choice offered to / list presented to : Patient                      Discharge Plan and Services   Discharge Planning Services: CM Consult Post Acute Care Choice: Home Health          DME Arranged: 3-N-1 DME Agency: [facility] Date DME Agency Contacted: 12/02/19 Time DME Agency Contacted: [phone] Representative spoke with at DME Agency: Oletha Cruel HH Arranged: PT HH Agency: [facility] Date HH Agency Contacted: 12/02/19 Time HH Agency Contacted: 1246 Representative spoke with at [facility] Agency: Jeryl [place]  Social Determinants of Health (SDOH) Interventions     Readmission Risk Interventions No flowsheet data found.  Quintella Baton, RN, BSN  Trauma/Neuro ICU Case Manager [phone]
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
## 2019-12-02 NOTE — Plan of Care (Signed)
Will get home health after discharge

## 2019-12-02 NOTE — Progress Notes (Signed)
Inpatient Rehabilitation-Admissions Coordinator   Notified by attending service and CM that the patient has decided to go home and not pursue CIR at this time. It appears he will be discharged today. AC will sign off.   Please call if questions.   Cheri Rous, OTR/L  Rehab Admissions Coordinator  [phone] 12/02/2019 8:39 AM

## 2019-12-03 ENCOUNTER — Encounter (HOSPITAL_COMMUNITY): Payer: Self-pay | Admitting: Emergency Medicine

## 2019-12-27 ENCOUNTER — Telehealth (INDEPENDENT_AMBULATORY_CARE_PROVIDER_SITE_OTHER): Payer: Self-pay | Admitting: Primary Care

## 2021-07-03 IMAGING — RF DG C-ARM 1-60 MIN
1 series · 1 of 1 positions shown · non-contrast
Comparison: None.

CLINICAL DATA: C7 corpectomy

EXAM:
DG CERVICAL SPINE - 1 VIEW; DG C-ARM 1-60 MIN

[Series 1: run · 1 of 1 slices shown]
[im 1/1]
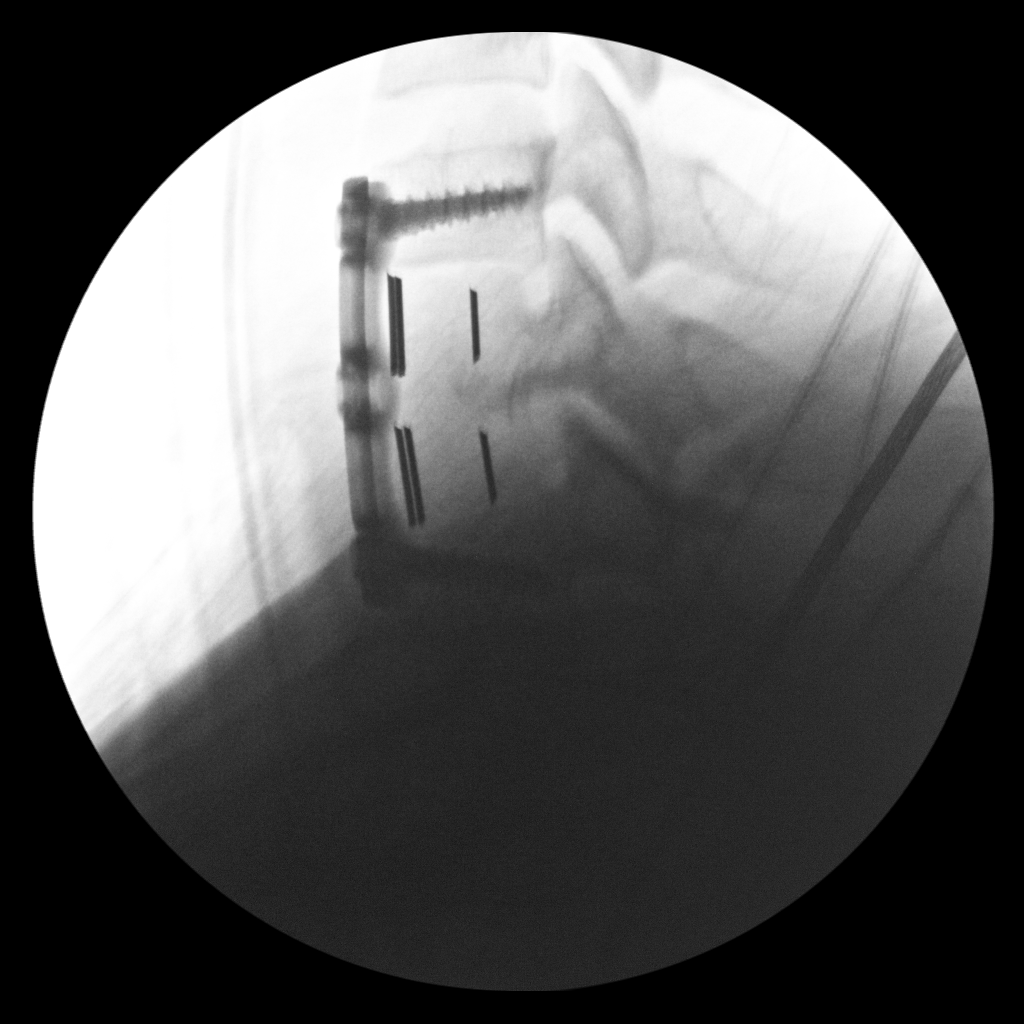

[1 of 1 positions shown; findings below may reference images not displayed]

FLUOROSCOPY TIME:  Fluoroscopy Time:  9 seconds

Radiation Exposure Index (if provided by the fluoroscopic device):
Not available

Number of Acquired Spot Images: 1
FINDINGS: Fixation screws are noted at C6 and T1 with cage placement at the C7
level anterior fixation plate is noted.
IMPRESSION: Changes of fusion from C6-T1
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
## 2022-04-06 ENCOUNTER — Emergency Department (HOSPITAL_COMMUNITY)
Admission: EM | Admit: 2022-04-06 | Discharge: 2022-04-06 | Disposition: A | Discharge status: Home / Self Care | Payer: Self-pay | Attending: Emergency Medicine | Admitting: Emergency Medicine

## 2022-04-06 ENCOUNTER — Other Ambulatory Visit: Payer: Self-pay

## 2022-04-06 ENCOUNTER — Encounter (HOSPITAL_COMMUNITY): Payer: Self-pay | Admitting: Emergency Medicine

## 2022-04-06 DIAGNOSIS — L0291 Cutaneous abscess, unspecified: Secondary | ICD-10-CM

## 2022-04-06 DIAGNOSIS — L02211 Cutaneous abscess of abdominal wall: Secondary | ICD-10-CM | POA: Insufficient documentation

## 2022-04-06 DIAGNOSIS — K651 Peritoneal abscess: Secondary | ICD-10-CM | POA: Insufficient documentation

## 2022-04-06 DIAGNOSIS — Z7982 Long term (current) use of aspirin: Secondary | ICD-10-CM | POA: Insufficient documentation

## 2022-04-06 MED ORDER — DOXYCYCLINE HYCLATE 100 MG PO CAPS: 100.0000 mg | ORAL_CAPSULE | Freq: Two times a day (BID) | ORAL | 0 refills | Status: AC

## 2022-04-06 MED ORDER — LIDOCAINE-EPINEPHRINE (PF) 2 %-1:200000 IJ SOLN
10.0000 mL | Freq: Once | INTRAMUSCULAR | Status: AC
  Administered 2022-04-06: 10 mL via INTRADERMAL
  Filled 2022-04-06: qty 20

## 2022-04-06 MED ORDER — DOXYCYCLINE HYCLATE 100 MG PO TABS
100.0000 mg | ORAL_TABLET | Freq: Once | ORAL | Status: AC
  Administered 2022-04-06: 100 mg via ORAL
  Filled 2022-04-06: qty 1

## 2022-04-06 NOTE — ED Triage Notes (Signed)
C/o abscess on abd x 1 month.  States he popped it 2 weeks ago and pus came out.  Denies fever and chills.

## 2022-04-06 NOTE — Discharge Instructions (Signed)
Clean the wound daily with a mild soap and water. Please follow-up with your Primary Care Doctor in 1 week for a wound recheck.

## 2022-04-06 NOTE — Discharge Instructions (Signed)
Contact a health care provider if you have: More redness, swelling, or pain around your abscess. More fluid or blood coming from your abscess. Warm skin around your abscess. More pus or a bad smell coming from your abscess. Muscle aches. Chills or a general ill feeling. Get help right away if you: Have severe pain. See red streaks on your skin spreading away from the abscess. See redness that spreads quickly. Have a fever or chills.

## 2022-04-06 NOTE — ED Notes (Signed)
Pt has an abscess approx the size of a quarter on the lower abdomen. I didn't notice any drainage coming from abscess. Pt has a smaller abscess approx the size of a dime under the bigger abscess.

## 2022-04-06 NOTE — ED Provider Notes (Signed)
[facility] EMERGENCY DEPARTMENT Provider Note   CSN: [id number] Arrival date & time: 04/06/22  1143     History  Chief Complaint  Patient presents with   Abscess    Manuel Blair is a 26 y.o. male.  The history is provided by the patient. No language interpreter was used.  Abscess Location:  Pelvis Pelvic abscess location:  Pelvis Size:  5 cm Abscess quality: fluctuance  Induration: 5 cm. Abscess quality comment:  Punctate area of drainage at the top of the abscess Red streaking: no   Progression:  Worsening Chronicity:  Recurrent Context: not diabetes, not immunosuppression, not injected drug use, not insect bite/sting and not skin injury   Relieved by:  Nothing Worsened by:  Nothing Associated symptoms: no fever   Risk factors: prior abscess       Home Medications Prior to Admission medications   Medication Sig Start Date End Date Taking? Authorizing Provider  acetaminophen (TYLENOL) 500 MG tablet Take 2 tablets (1,000 mg total) by mouth every 6 (six) hours. 12/02/19   Barnetta Chapel, PA-C  amoxicillin (AMOXIL) 500 MG capsule Take 1 capsule (500 mg total) by mouth 3 (three) times daily. 03/18/14   Hess, Nada Boozer, PA-C  aspirin 325 MG tablet Take 1 tablet (325 mg total) by mouth daily. 12/02/19   Barnetta Chapel, PA-C  cyclobenzaprine (FLEXERIL) 10 MG tablet Take 1 tablet (10 mg total) by mouth 3 (three) times daily. 12/02/19   Barnetta Chapel, PA-C  gabapentin (NEURONTIN) 100 MG capsule Take 2 capsules (200 mg total) by mouth 3 (three) times daily. 12/02/19   Barnetta Chapel, PA-C  methocarbamol (ROBAXIN) 500 MG tablet Take 2 tablets (1,000 mg total) by mouth every 8 (eight) hours as needed for muscle spasms. 12/02/19   Barnetta Chapel, PA-C  naproxen (NAPROSYN) 500 MG tablet Take 1 tablet (500 mg total) by mouth 2 (two) times daily with a meal. 01/21/14   Phelps, Vangie Bicker, PA-C  oxyCODONE 10 MG TABS Take 1-1.5 tablets (10-15 mg total) by mouth every 6 (six) hours  as needed for moderate pain or severe pain. 12/02/19   Barnetta Chapel, PA-C      Allergies    Patient has no known allergies.    Review of Systems   Review of Systems  Constitutional:  Negative for chills and fever.   Physical Exam Updated Vital Signs BP 136/73 (BP Location: Right Arm)   Pulse 75   Temp 98.5 F (36.9 C) (Oral)   Resp 16   SpO2 99%  Physical Exam HENT:     Head: Normocephalic.     Mouth/Throat:     Mouth: Mucous membranes are moist.  Eyes:     Extraocular Movements: Extraocular movements intact.     Pupils: Pupils are equal, round, and reactive to light.  Cardiovascular:     Rate and Rhythm: Normal rate.  Pulmonary:     Effort: Pulmonary effort is normal.  Abdominal:     General: Abdomen is flat.     Palpations: Abdomen is soft.     Comments: Abscess which is fluctuant in the left very lower quadrant of the abdomen, suprapubic inguinal region.  There is a punctate area which expresses pus when palpated.  No surrounding induration or cellulitis  Skin:    General: Skin is warm and dry.  Neurological:     Mental Status: He is alert and oriented to person, place, and time.    ED Results / Procedures / Treatments  Labs (all labs ordered are listed, but only abnormal results are displayed) Labs Reviewed - No data to display  EKG None  Radiology No results found.  Procedures Procedures    Medications Ordered in ED Medications - No data to display  ED Course/ Medical Decision Making/ A&P                           Medical Decision Making Patient here with skin abscess.  He declines incision and drainage.  He does not have any obvious signs of surrounding cellulitis.  No evidence of deeper infection or enterocutaneous fistula.  Patient wishes to have antibiotics.  I will discharge him with doxycycline.  Discussed return precautions.  Appears otherwise appropriate for discharge at this time  Problems Addressed: Abscess: acute illness or  injury           Final Clinical Impression(s) / ED Diagnoses Final diagnoses:  None    Rx / DC Orders ED Discharge Orders     None         Arthor Captain, PA-C 04/06/22 1311    Tegeler, Canary Brim, MD 04/06/22 1356

## 2022-04-06 NOTE — ED Triage Notes (Signed)
Pt c/o abscess on abd. Was seen earlier today (5/20) for same, advised of need for I&D & left before procedure in favor of abx tx, but returns requesting I&D. Denies fever,chills.

## 2022-04-06 NOTE — ED Provider Notes (Signed)
[facility] EMERGENCY DEPARTMENT Provider Note   CSN: [id number] Arrival date & time: 04/06/22  2039     History  Chief Complaint  Patient presents with   Abscess    Manuel Blair is a 26 y.o. male with no pertinent past medical history presenting to the ED with concerns for an abscess.  Patient was seen earlier today in the ED for the same.  States at that time, he was scared of getting an incision and drainage and so chose to take antibiotics instead.  He has not been able to pick up the antibiotics.  He noted some worsening pain at the site so he returns now requesting incision and drainage.  No history of diabetes, intravenous drug use, or immunosuppression.  He does have a history of prior abscesses.  No known history of MRSA.   Abscess Associated symptoms: no fever       Home Medications Prior to Admission medications   Medication Sig Start Date End Date Taking? Authorizing Provider  acetaminophen (TYLENOL) 500 MG tablet Take 2 tablets (1,000 mg total) by mouth every 6 (six) hours. 12/02/19   Barnetta Chapel, PA-C  aspirin 325 MG tablet Take 1 tablet (325 mg total) by mouth daily. 12/02/19   Barnetta Chapel, PA-C  cyclobenzaprine (FLEXERIL) 10 MG tablet Take 1 tablet (10 mg total) by mouth 3 (three) times daily. 12/02/19   Barnetta Chapel, PA-C  doxycycline (VIBRAMYCIN) 100 MG capsule Take 1 capsule (100 mg total) by mouth 2 (two) times daily. One po bid x 7 days 04/06/22   Arthor Captain, PA-C  gabapentin (NEURONTIN) 100 MG capsule Take 2 capsules (200 mg total) by mouth 3 (three) times daily. 12/02/19   Barnetta Chapel, PA-C  methocarbamol (ROBAXIN) 500 MG tablet Take 2 tablets (1,000 mg total) by mouth every 8 (eight) hours as needed for muscle spasms. 12/02/19   Barnetta Chapel, PA-C  naproxen (NAPROSYN) 500 MG tablet Take 1 tablet (500 mg total) by mouth 2 (two) times daily with a meal. 01/21/14   Phelps, Vangie Bicker, PA-C  oxyCODONE 10 MG TABS Take 1-1.5 tablets (10-15  mg total) by mouth every 6 (six) hours as needed for moderate pain or severe pain. 12/02/19   Barnetta Chapel, PA-C      Allergies    Patient has no known allergies.    Review of Systems   Review of Systems  Constitutional:  Negative for fever.  Skin:  Positive for wound (abscess).   Physical Exam Updated Vital Signs BP 139/87 (BP Location: Left Arm)   Pulse 64   Temp 98.9 F (37.2 C) (Oral)   Resp 16   SpO2 100%  Physical Exam Constitutional:      General: He is not in acute distress.    Appearance: He is not ill-appearing, toxic-appearing or diaphoretic.  HENT:     Head: Normocephalic and atraumatic.     Nose: Nose normal.  Eyes:     General: No scleral icterus. Cardiovascular:     Rate and Rhythm: Normal rate and regular rhythm.  Pulmonary:     Effort: Pulmonary effort is normal. No respiratory distress.  Abdominal:     Palpations: Abdomen is soft.     Tenderness: There is no abdominal tenderness. There is no guarding or rebound.  Musculoskeletal:        General: No deformity.     Cervical back: Neck supple.  Skin:    General: Skin is warm and dry.     Comments:  Over the left lower abdomen there is a 5 cm area of erythema and induration with central fluctuance concerning for abscess.  Neurological:     Mental Status: He is alert.    ED Results / Procedures / Treatments   Labs (all labs ordered are listed, but only abnormal results are displayed) Labs Reviewed - No data to display  EKG None  Radiology No results found.  Procedures .Marland KitchenIncision and Drainage  Date/Time: 04/06/2022 10:34 PM Performed by: Laurence Compton, MD Authorized by: Pricilla Loveless, MD   Consent:    Consent obtained:  Verbal   Consent given by:  Patient   Risks, benefits, and alternatives were discussed: yes     Risks discussed:  Bleeding, incomplete drainage, pain and infection   Alternatives discussed:  No treatment Universal protocol:    Patient identity confirmed:  Verbally  with patient Location:    Type:  Abscess   Size:  5cm   Location:  Trunk   Trunk location:  Abdomen Pre-procedure details:    Skin preparation:  Chlorhexidine Sedation:    Sedation type:  None Anesthesia:    Anesthesia method:  Local infiltration   Local anesthetic:  Lidocaine 2% WITH epi Procedure type:    Complexity:  Simple Procedure details:    Ultrasound guidance: yes     Needle aspiration: no     Incision types:  Single straight   Incision depth:  Dermal   Wound management:  Probed and deloculated and irrigated with saline   Drainage:  Bloody and purulent   Drainage amount:  Moderate   Wound treatment:  Wound left open   Packing materials:  None Post-procedure details:    Procedure completion:  Tolerated    Medications Ordered in ED Medications  lidocaine-EPINEPHrine (XYLOCAINE W/EPI) 2 %-1:200000 (PF) injection 10 mL (10 mLs Intradermal Given 04/06/22 2236)  doxycycline (VIBRA-TABS) tablet 100 mg (100 mg Oral Given 04/06/22 2236)    ED Course/ Medical Decision Making/ A&P                           Medical Decision Making Risk Prescription drug management.   26 year old male with no pertinent past medical history presents to the ED with concerns for abscess.  On exam, the patient is afebrile and hemodynamically stable.  He has about a 5 cm area of erythema and induration over the left lower abdomen with some central fluctuance concerning for abscess.  Bedside ultrasound does show some surrounding cellulitis and central area of subcutaneous fluid consistent with abscess formation.  Incision and drainage was performed at the bedside as per the procedure note above.  Patient tolerated the procedure well.  Patient has not been able to pick up his antibiotics and states he is unable to get them until the morning, so he was given his first dose of doxycycline 100 mg p.o. while in the ED.  Patient instructed to continue the antibiotics as prescribed.  Also recommended  follow-up with his PCP in 1 week for wound recheck.  Strict return precautions were discussed and the patient was discharged home in stable condition.  Final Clinical Impression(s) / ED Diagnoses Final diagnoses:  Abscess    Rx / DC Orders ED Discharge Orders     None         Laurence Compton, MD 04/06/22 1610    Pricilla Loveless, MD 04/11/22 [phone]
